# Patient Record
Sex: Male | Born: 1995 | Race: White | Hispanic: No
Health system: Western US, Academic
[De-identification: ages and names within clinical notes are randomized; demographics above are authoritative.]

---

## 2014-11-16 ENCOUNTER — Ambulatory Visit (INDEPENDENT_AMBULATORY_CARE_PROVIDER_SITE_OTHER): Payer: Self-pay | Admitting: Family Medicine

## 2014-11-16 ENCOUNTER — Encounter (INDEPENDENT_AMBULATORY_CARE_PROVIDER_SITE_OTHER): Payer: Self-pay | Admitting: Family Medicine

## 2014-11-16 VITALS — BP 136/84 | HR 98 | Temp 98.6°F | Resp 14 | Wt 200.0 lb

## 2014-11-16 DIAGNOSIS — J0100 Acute maxillary sinusitis, unspecified: Secondary | ICD-10-CM

## 2014-11-16 DIAGNOSIS — H6501 Acute serous otitis media, right ear: Secondary | ICD-10-CM

## 2014-11-16 MED ORDER — AMOXICILLIN-POT CLAVULANATE 875-125 MG OR TABS
1.0000 | ORAL_TABLET | Freq: Two times a day (BID) | ORAL | Status: AC
Start: 2014-11-16 — End: 2014-11-26

## 2014-11-16 MED ORDER — IPRATROPIUM BROMIDE 0.06 % NA SOLN
2.0000 | Freq: Three times a day (TID) | NASAL | Status: AC
Start: 2014-11-16 — End: 2014-11-23

## 2014-11-16 NOTE — Progress Notes (Signed)
Patient Referred By: No ref. provider found  Patient's PCP: No primary care provider on file.   [facility]    Urgent Care Family Medicine Note    Chief Complaint    [name] is a 19 year old year old male who presents to [facility] for   Chief Complaint   Patient presents with   . URI     pt co R ear plugged and sinus congestion with yellow phlegm worsening over last 3-4 days//kw          Subjective:  Patient is a 19 year old male, here to discuss URI        URI   This is a new problem. The current episode started in the past 7 days. The problem has been rapidly worsening. There has been no fever. Associated symptoms include congestion, coughing, ear pain, headaches, a plugged ear sensation, rhinorrhea and sinus pain. Pertinent negatives include no abdominal pain, chest pain, diarrhea, joint pain, joint swelling, nausea, neck pain, rash, sneezing, sore throat, swollen glands, vomiting or wheezing. He has tried acetaminophen and increased fluids for the symptoms. The treatment provided mild relief.       Review of Systems   Constitutional: Negative for fever and chills.   HENT: Positive for congestion, ear pain and rhinorrhea. Negative for sneezing and sore throat.    Eyes: Negative for pain and discharge.   Respiratory: Positive for cough. Negative for wheezing.    Cardiovascular: Negative for chest pain.   Gastrointestinal: Negative for nausea, vomiting, abdominal pain and diarrhea.   Musculoskeletal: Negative for joint pain and neck pain.   Skin: Negative for rash.   Allergic/Immunologic: Negative for environmental allergies.   Neurological: Positive for headaches. Negative for dizziness.   Hematological: Negative for adenopathy.   Psychiatric/Behavioral: Negative for behavioral problems and agitation.     I personally reviewed and confirmed the ROS, past medical history, family history and social history in the record with the patient today.       Objective:  Physical Exam   Constitutional: He is oriented to person, place, and time. He appears well-developed and well-nourished. No distress.   HENT:   Head: Normocephalic and atraumatic.   Right Ear: External ear normal. Tympanic membrane is injected. A middle ear effusion is present.   Left Ear: External ear normal.   Mouth/Throat: Oropharynx is clear and moist. No oropharyngeal exudate.   Air fluid levels.   Eyes: Conjunctivae and EOM are normal. Pupils are equal, round, and reactive to light. No scleral icterus.   Neck: Normal range of motion. Neck supple.   Cardiovascular: Normal rate, regular rhythm and normal heart sounds.    Pulmonary/Chest: Effort normal and breath sounds normal.   Lymphadenopathy:     He has no cervical adenopathy.   Neurological: He is alert and oriented to person, place, and time.   Skin: Skin is warm and dry.   Psychiatric: He has a normal mood and affect. His behavior is normal.        Assessment and Plan:   Diagnoses and associated orders for this visit:    Right acute serous otitis media, recurrence not specified  - Ipratropium Bromide 0.06 % Nasal Solution; Spray 2 sprays into each nostril 3 times a day for 7 days.  - Amoxicillin-Pot Clavulanate (AUGMENTIN) 875-125 MG Oral Tab; Take 1 tablet by mouth 2 times a day for 10 days. Take until gone.    Acute  maxillary sinusitis, recurrence not specified  - Ipratropium Bromide 0.06 % Nasal Solution; Spray 2 sprays into each nostril 3 times a day for 7 days.  - Amoxicillin-Pot Clavulanate (AUGMENTIN) 875-125 MG Oral Tab; Take 1 tablet by mouth 2 times a day for 10 days. Take until gone.      ED, infection and medication precautions discussed. Expect improvement in 2-3 days. FU prn.

## 2014-11-16 NOTE — Patient Instructions (Signed)
Sinusitis (Antibiotic Treatment)    The sinuses are air-filled spaces within the bones of the face. They connect to the inside of the nose.Sinusitisis an inflammation of the tissue lining the sinus cavity. Sinus inflammation can occur during a cold. It can also be due to allergies to pollens and other particles in the air. Sinusitis can cause symptoms of sinus congestion and fullness. A sinus infection causes fever, headache and facial pain. There is often green or yellow drainage from the nose or into the back of the throat (post-nasal drip). You have been given antibiotics to treat this condition.  Home care:   Take the full course of antibiotics as instructed. Do not stop taking them, even if you feel better.   Drink plenty of water, hot tea, and other liquids. This may help thin mucus. It also may promote sinus drainage.   Heat may help soothe painful areas of the face. Use a towel soaked in hot water. Or, stand in the shower and direct the hot spray onto your face. Using a vaporizer along with a menthol rub at night may also help.   Anexpectorantcontaining guaifenesin may help thin the mucus and promote drainage from the sinuses.   Over-the-counterdecongestantsmay be used unless a similar medicine was prescribed. Nasal sprays work the fastest. Use one that contains phenylephrine or oxymetazoline. First blow the nose gently. Then use the spray. Do not use these medicines more often than directed on the label or symptoms may get worse. You may also use tablets containing pseudoephedrine. Avoid products that combine ingredients, because side effects may be increased. Read labels. You can also ask the pharmacist for help. (NOTE:Persons with high blood pressure should not use decongestants. They can raise blood pressure.)   Over-the-counterantihistaminesmay help if allergies contributed to your sinusitis.    Do not use nasal rinses or irrigation during an acute sinus infection, unless told to by  your health care provider. Rinsing may spread the infection to other sinuses.   Use acetaminophen or ibuprofen to control pain, unless another pain medicine was prescribed. (If you have chronic liver or kidney disease or ever had a stomach ulcer, talk with your doctor before using these medicines. Aspirin should never be used in anyone under 18 years of age who is ill with a fever. It may cause severe liver damage.)   Don't smoke. This can worsen symptoms.  Follow-up care  Follow up with your healthcare provider or our staff if you are not improving within the next week.  When to seek medical advice  Call your healthcare provider if any of these occur:   Facial pain or headache becoming more severe   Stiff neck   Unusual drowsiness or confusion   Swelling of the forehead or eyelids   Vision problems, including blurred or double vision   Fever of100.4F (38C)or higher, or as directed by your healthcare provider   Seizure   Breathing problems   Symptoms not resolving within 10 days   2000-2015 [employer] [address], [place] [address]. All rights reserved. This information is not intended as a substitute for professional medical care. Always follow your healthcare professional's instructions.

## 2023-01-12 IMAGING — MR MRI BRAIN WITHOUT AND WITH CONTRAST
8 series · 48 of 48 positions shown · IV contrast (Off)
Comparison: none

MRI OF THE BRAIN WITH & WITHOUT CONTRAST
CLINICAL HISTORY: Headaches, dizziness, sinusitis.
TECHNIQUE: Multisequential multiplanar imaging was performed of the brain with and without the intravenous administration of 8 cc of Gadavist. Examination included diffusion-weighted imaging.

[Series 2: T2 · axial · 5.0mm · 0.94mm/px · z∈[-61,+83]mm · 6 of 22 slices shown]
[im 1/22]
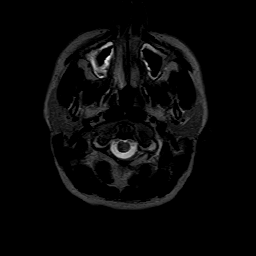
[im 5/22]
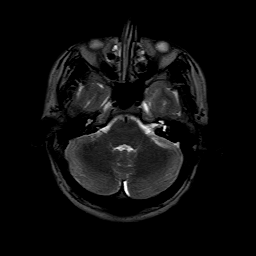
[im 9/22]
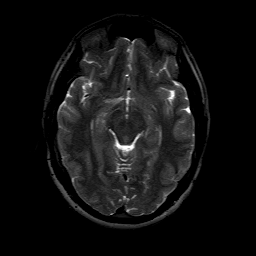
[im 13/22]
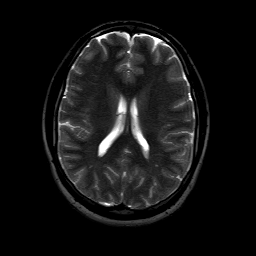
[im 17/22]
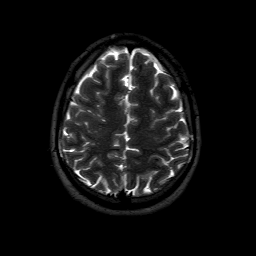
[im 22/22]
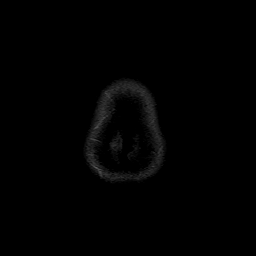

[Series 2: T1 · sagittal · 5.0mm · 0.90mm/px · 5 of 17 slices shown (1 of 4)]
[im 1/17]
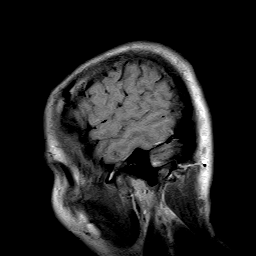
[im 5/17]
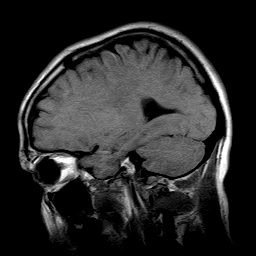
[im 9/17]
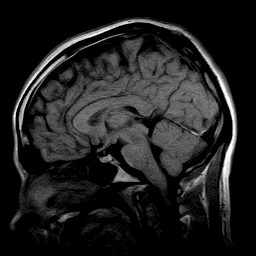
[im 13/17]
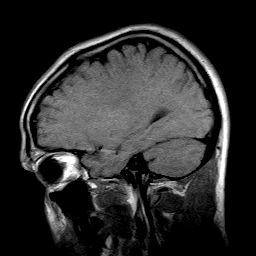
[im 17/17]
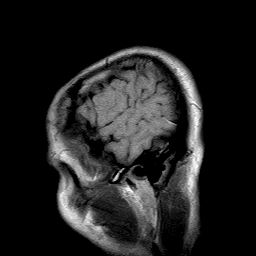

[Series 3: T1 · axial · 5.0mm · 0.94mm/px · z∈[-61,+83]mm · 7 of 22 slices shown (2 of 4)]
[im 1/22]
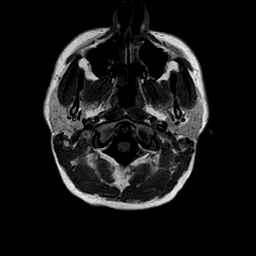
[im 4/22]
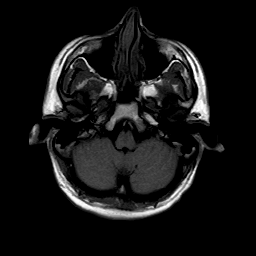
[im 8/22]
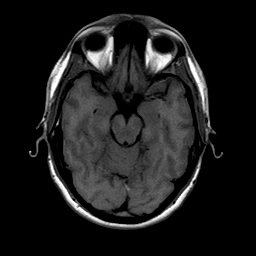
[im 11/22]
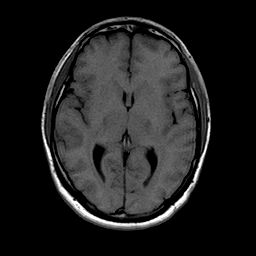
[im 15/22]
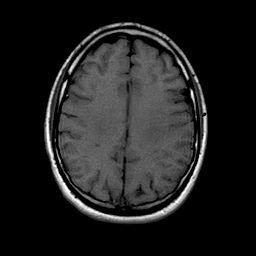
[im 18/22]
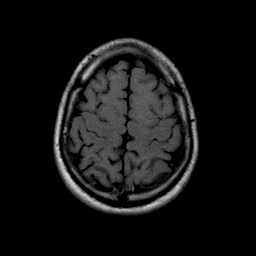
[im 22/22]
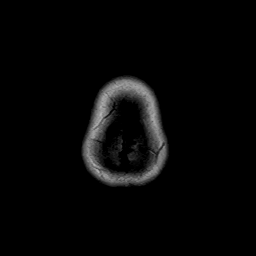

[Series 9: DWI · axial · 6.0mm · 1.88mm/px · z∈[-32,+104]mm · 5 of 18 slices shown]
[im 1/18]
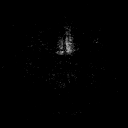
[im 5/18]
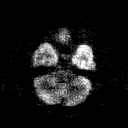
[im 9/18]
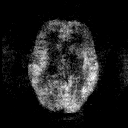
[im 13/18]
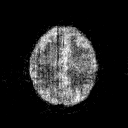
[im 18/18]
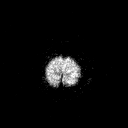

[Series 10: ADC · axial · 6.0mm · 1.88mm/px · z∈[-32,+104]mm · 5 of 18 slices shown]
[im 1/18]
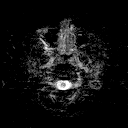
[im 5/18]
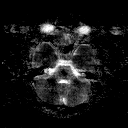
[im 9/18]
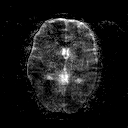
[im 13/18]
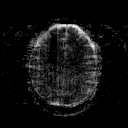
[im 18/18]
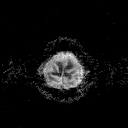

[Series 11: FLAIR · axial · 5.0mm · 0.94mm/px · z∈[-61,+83]mm · 7 of 22 slices shown]
[im 1/22]
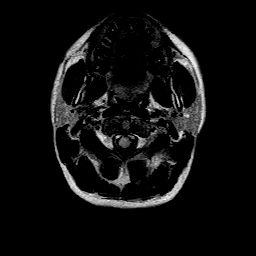
[im 4/22]
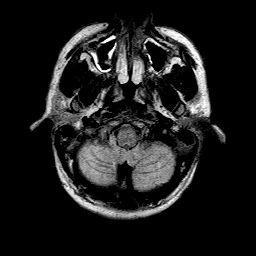
[im 8/22]
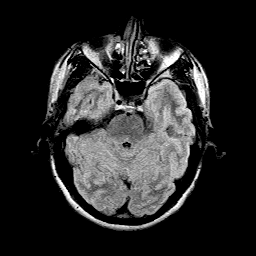
[im 11/22]
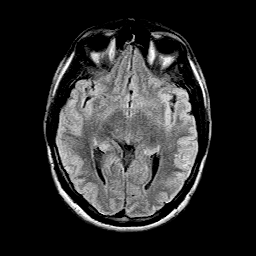
[im 15/22]
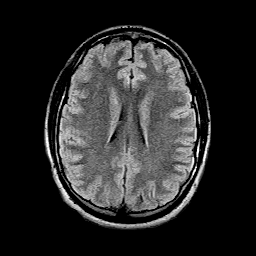
[im 18/22]
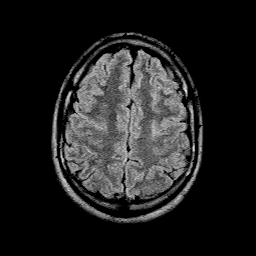
[im 22/22]
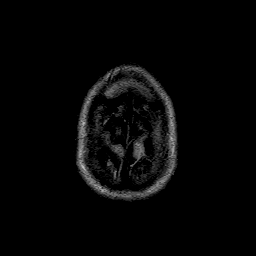

[Series 12: T1 · axial · 5.0mm · 0.94mm/px · z∈[-61,+83]mm · 7 of 22 slices shown (3 of 4)]
[im 1/22]
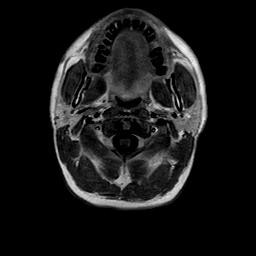
[im 4/22]
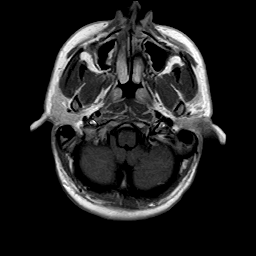
[im 8/22]
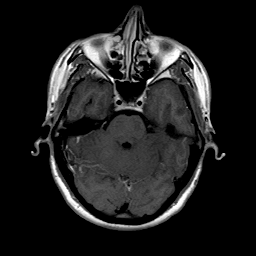
[im 11/22]
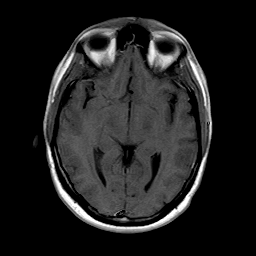
[im 15/22]
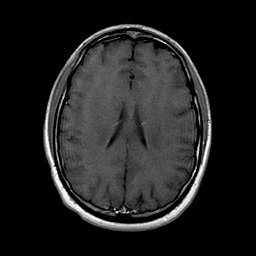
[im 18/22]
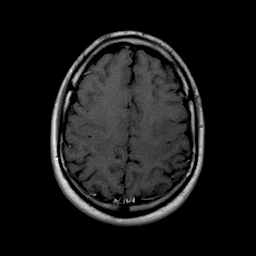
[im 22/22]
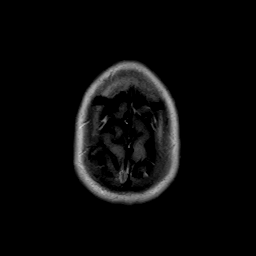

[Series 13: T1 · coronal · 5.0mm · 0.90mm/px · 6 of 20 slices shown (4 of 4)]
[im 1/20]
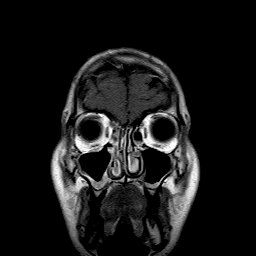
[im 4/20]
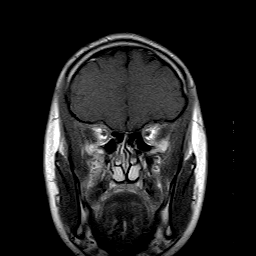
[im 8/20]
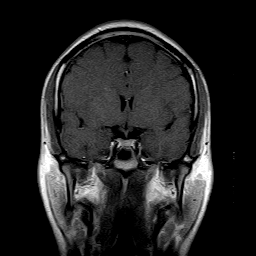
[im 12/20]
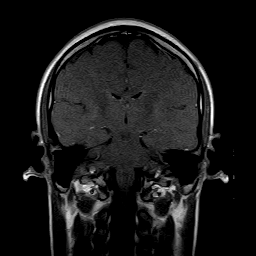
[im 16/20]
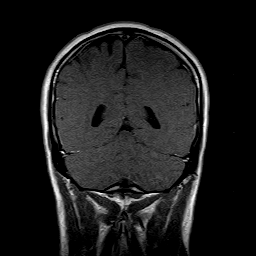
[im 20/20]
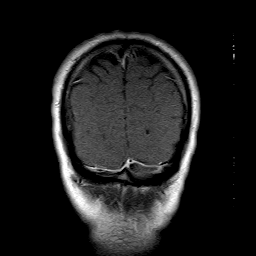

[48 of 48 positions shown; findings below may reference images not displayed]

FINDINGS: There is no MRI evidence of hemorrhage, mass effect, midline shift, extra-axial fluid collections, or hydrocephalus. No significant demyelination. No focal enhancing lesions are noted. There is no evidence of restricted diffusion. 

Evidence of ethmoid and maxillary sinus inflammatory changes.
IMPRESSION: 1.
Unremarkable plain and enhanced MRI of the brain without evidence of focal enhancing lesion. No significant demyelination. 

2.
No evidence of acute or subacute ischemia or other cause of restricted diffusion.  

3.
Ethmoid sinus inflammatory changes as well as mucoperiosteal thickening and sinus inflammatory changes in the maxillary sinuses.

## 2023-01-12 IMAGING — MR MRI IAC'S WITHOUT AND WITH CONTRAST
6 series · 48 of 48 positions shown · IV contrast (GADAVIST)
Comparison: none

MRI OF THE INTERNAL AUDITORY CANALS WITH & WITHOUT CONTRAST
CLINICAL HISTORY: Sinus inflammatory changes, facial pain.
TECHNIQUE: Thin-cut imaging performed at the level of the internal auditory canals with and without the intravenous administration of 8 cc of Gadavist.

[Series 3: T2 · axial · 5.0mm · 0.90mm/px · z∈[-66,+59]mm · 9 of 20 slices shown (1 of 2)]
[im 1/20]
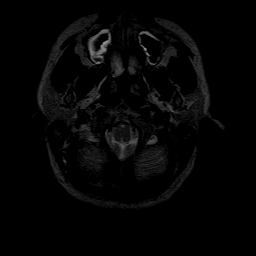
[im 3/20]
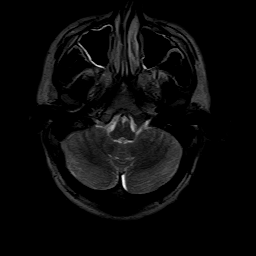
[im 5/20]
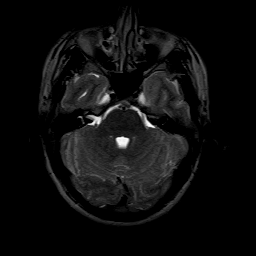
[im 8/20]
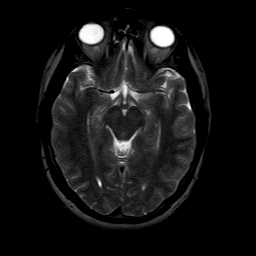
[im 10/20]
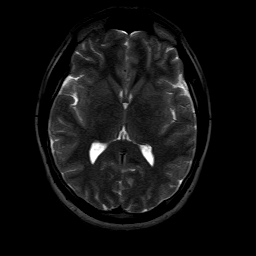
[im 12/20]
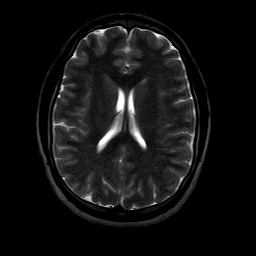
[im 15/20]
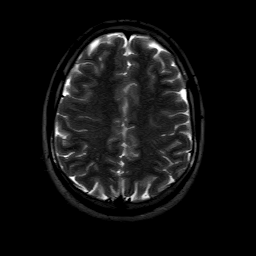
[im 17/20]
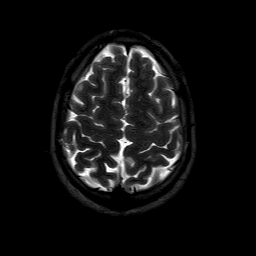
[im 20/20]
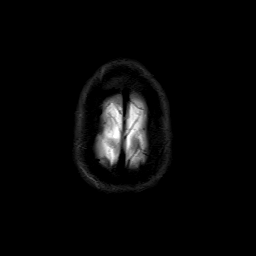

[Series 4: T1 · coronal · 2.0mm · 0.78mm/px · 7 of 16 slices shown (1 of 4)]
[im 1/16]
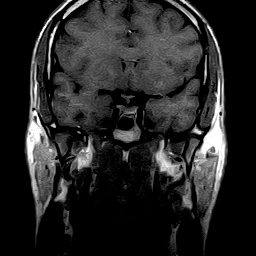
[im 3/16]
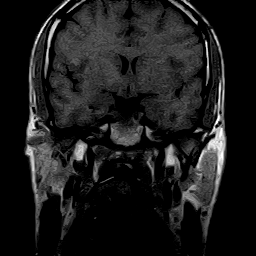
[im 6/16]
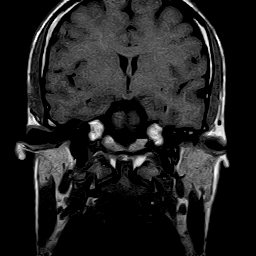
[im 8/16]
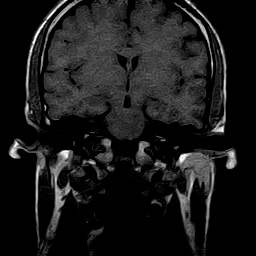
[im 11/16]
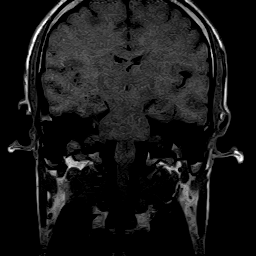
[im 13/16]
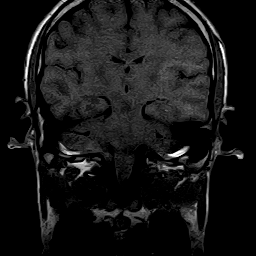
[im 16/16]
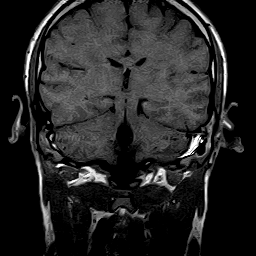

[Series 5: T1 · axial · 2.0mm · 0.78mm/px · z∈[-70,-36]mm · 8 of 16 slices shown (2 of 4)]
[im 1/16]
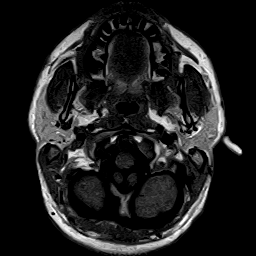
[im 3/16]
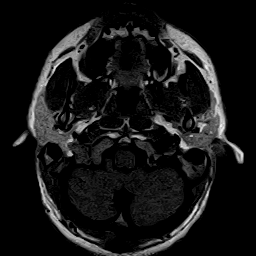
[im 5/16]
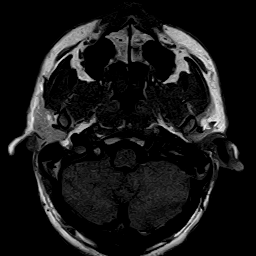
[im 7/16]
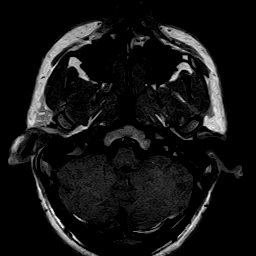
[im 9/16]
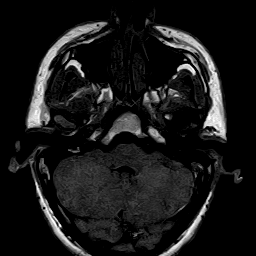
[im 11/16]
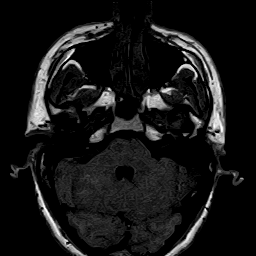
[im 13/16]
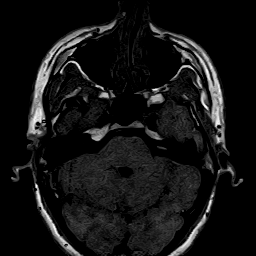
[im 16/16]
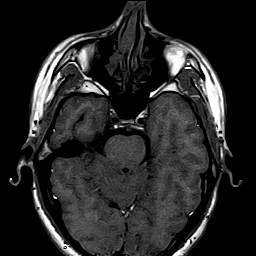

[Series 6: T2 · coronal · 3.0mm · 0.78mm/px · 8 of 16 slices shown (2 of 2)]
[im 1/16]
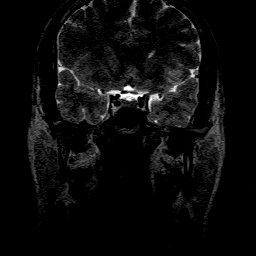
[im 3/16]
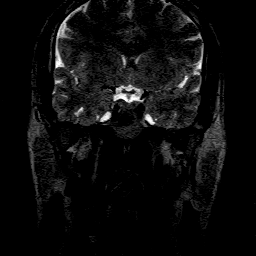
[im 5/16]
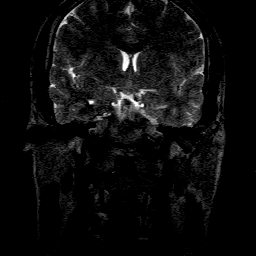
[im 7/16]
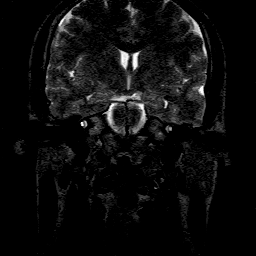
[im 9/16]
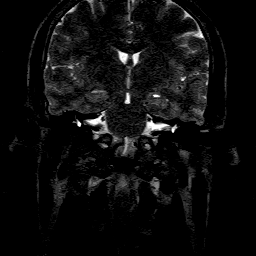
[im 11/16]
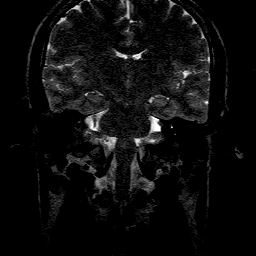
[im 13/16]
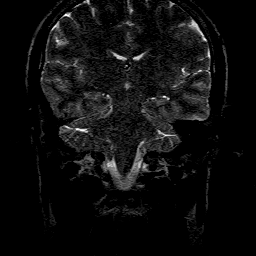
[im 16/16]
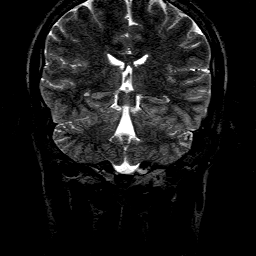

[Series 7: T1 · coronal · 2.0mm · 0.78mm/px · 8 of 16 slices shown (3 of 4)]
[im 1/16]
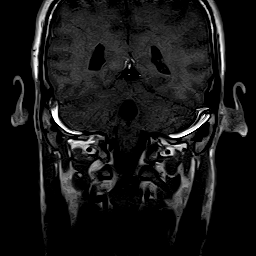
[im 3/16]
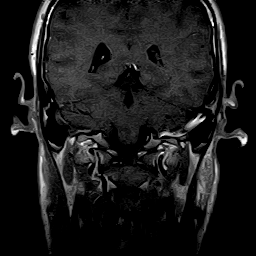
[im 5/16]
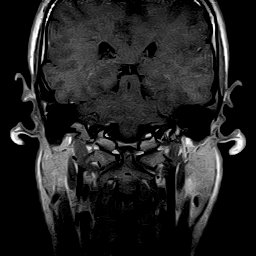
[im 7/16]
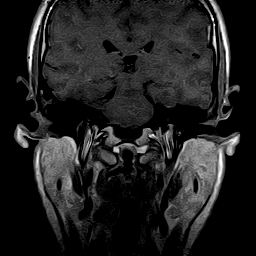
[im 9/16]
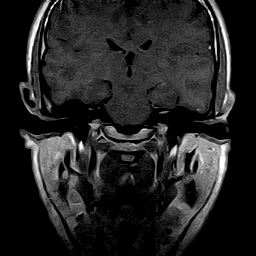
[im 11/16]
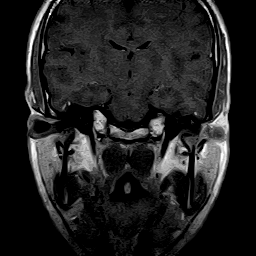
[im 13/16]
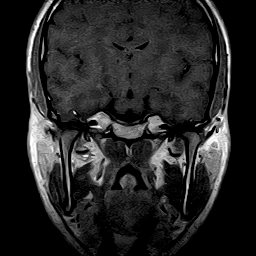
[im 16/16]
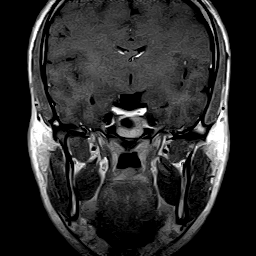

[Series 8: T1 · axial · 2.0mm · 0.78mm/px · z∈[-70,-36]mm · 8 of 16 slices shown (4 of 4)]
[im 1/16]
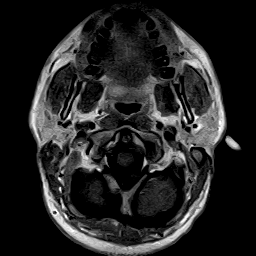
[im 3/16]
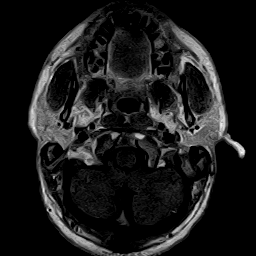
[im 5/16]
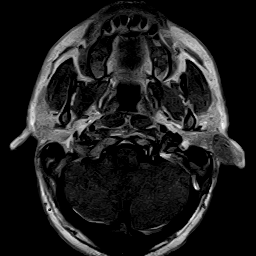
[im 7/16]
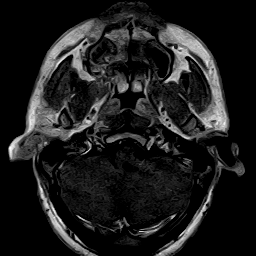
[im 9/16]
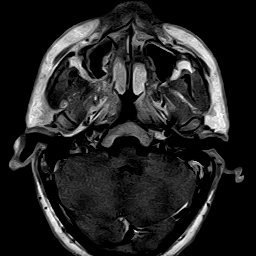
[im 11/16]
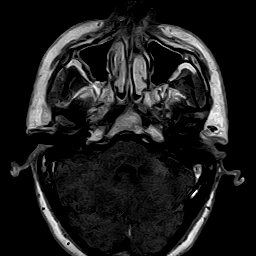
[im 13/16]
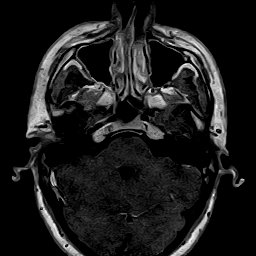
[im 16/16]
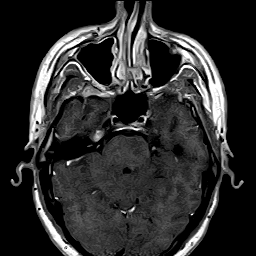

[48 of 48 positions shown; findings below may reference images not displayed]

FINDINGS: There is good visualization of the seventh and eighth nerve complex without evidence of focal enhancing lesion.
IMPRESSION: Good visualization of the seventh and eighth nerve complex without evidence of focal enhancing lesion. No abnormal enhancement in the visualized portion of the brainstem.
